# Patient Record
Sex: Female | Born: 1995 | Race: White | Hispanic: No | Marital: Single | Smoking: Never smoker
Health system: Southern US, Community
[De-identification: ages and names within clinical notes are randomized; demographics above are authoritative.]

## PROBLEM LIST (undated history)

## (undated) ENCOUNTER — Emergency Department (HOSPITAL_COMMUNITY): Payer: Self-pay | Source: Home / Self Care

## (undated) DIAGNOSIS — J45909 Unspecified asthma, uncomplicated: Secondary | ICD-10-CM

## (undated) DIAGNOSIS — F329 Major depressive disorder, single episode, unspecified: Secondary | ICD-10-CM

## (undated) DIAGNOSIS — F32A Depression, unspecified: Secondary | ICD-10-CM

## (undated) HISTORY — DX: Depression, unspecified: F32.A

## (undated) HISTORY — DX: Major depressive disorder, single episode, unspecified: F32.9

## (undated) HISTORY — PX: INCISION AND DRAINAGE: SHX5863

## (undated) HISTORY — PX: TONSILLECTOMY: SUR1361

## (undated) HISTORY — DX: Unspecified asthma, uncomplicated: J45.909

---
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
## 2016-01-26 IMAGING — US US TRANSVAGINAL NON-OB
1 series · 14 of 25 positions shown · non-contrast
Comparison: None.

CLINICAL DATA: Left adnexal pain, nausea/vomiting

EXAM:
TRANSABDOMINAL AND TRANSVAGINAL ULTRASOUND OF PELVIS
DOPPLER ULTRASOUND OF OVARIES
TECHNIQUE: Both transabdominal and transvaginal ultrasound examinations of the
pelvis were performed. Transabdominal technique was performed for
global imaging of the pelvis including uterus, ovaries, adnexal
regions, and pelvic cul-de-sac.
It was necessary to proceed with endovaginal exam following the
transabdominal exam to visualize the endometrium. Color and duplex
Doppler ultrasound was utilized to evaluate blood flow to the
ovaries.

[Series 1: us transvaginal non-ob · 0.18mm/px · 14 of 61 slices shown]
[im 1/61]
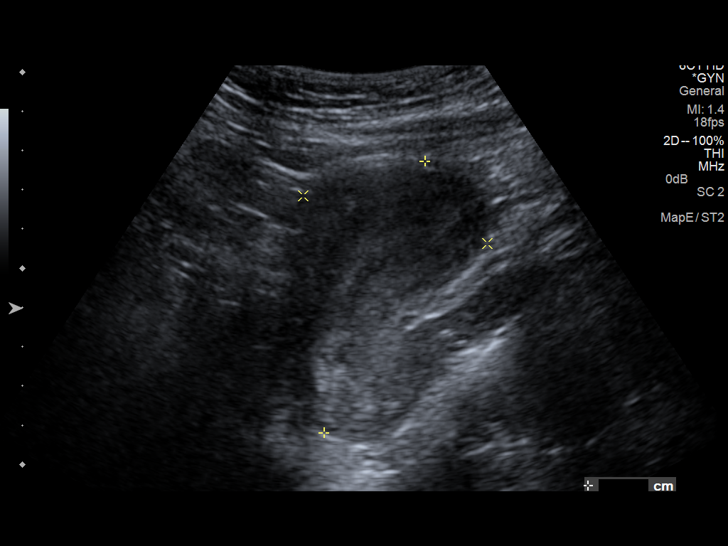
[im 6/61]
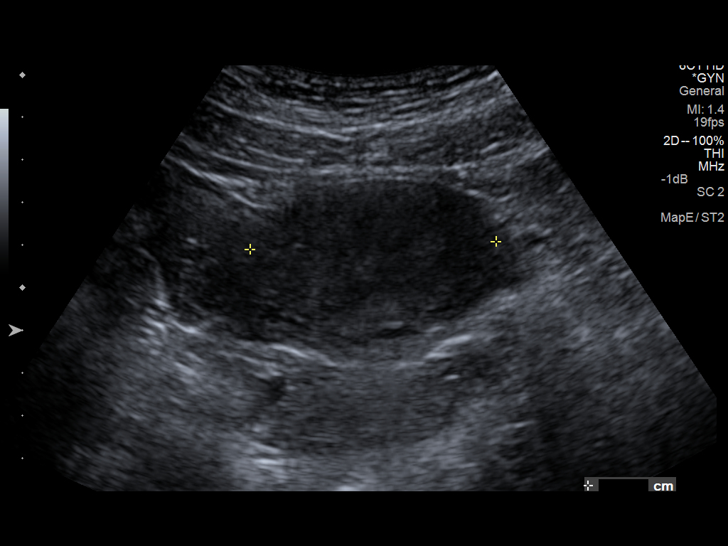
[im 11/61]
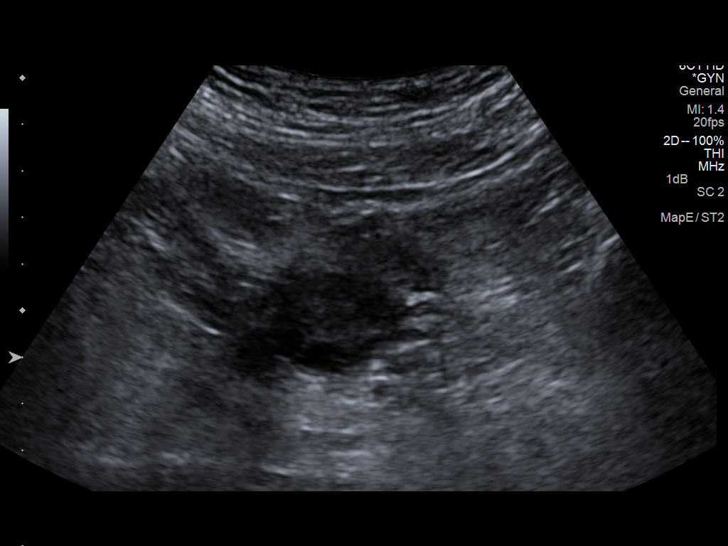
[im 16/61]
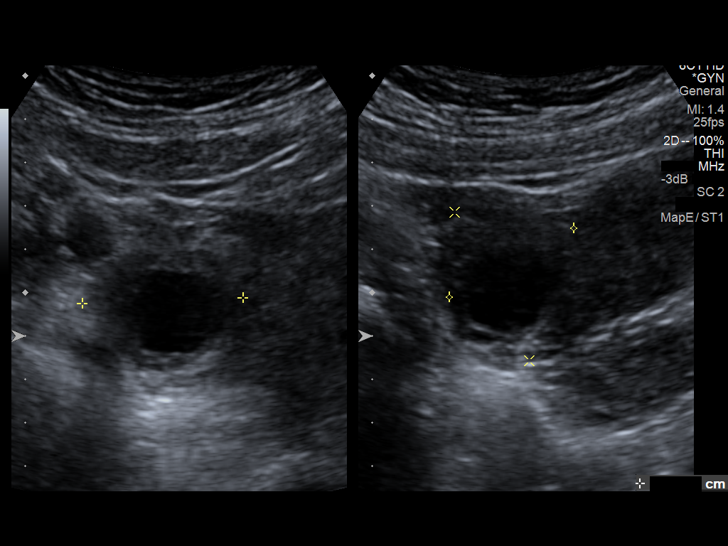
[im 21/61]
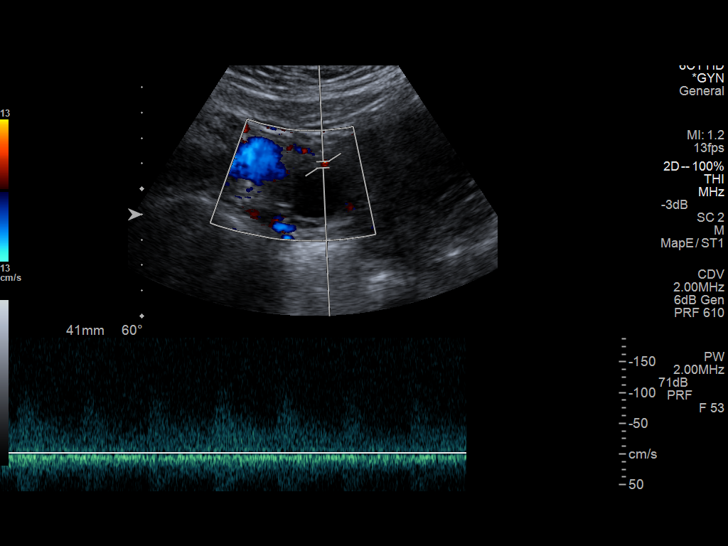
[im 23/61]
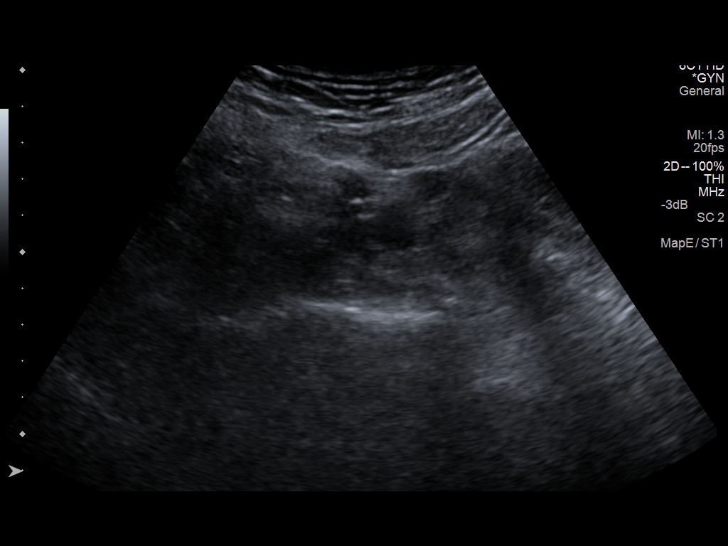
[im 28/61]
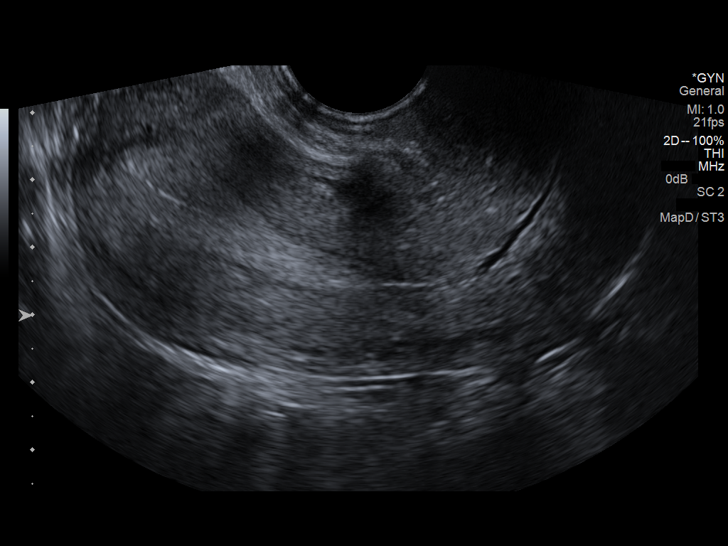
[im 33/61]
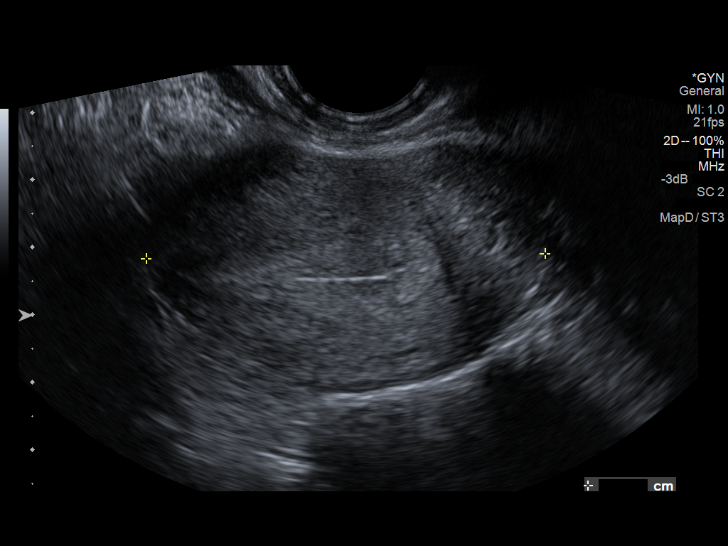
[im 38/61]
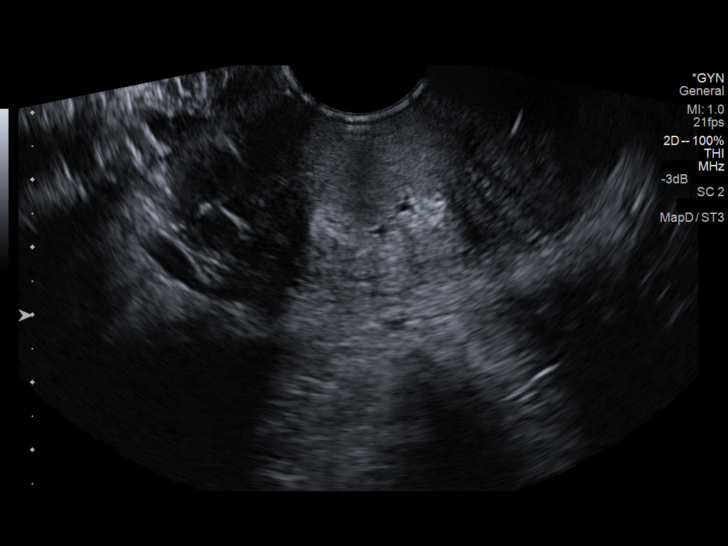
[im 41/61]
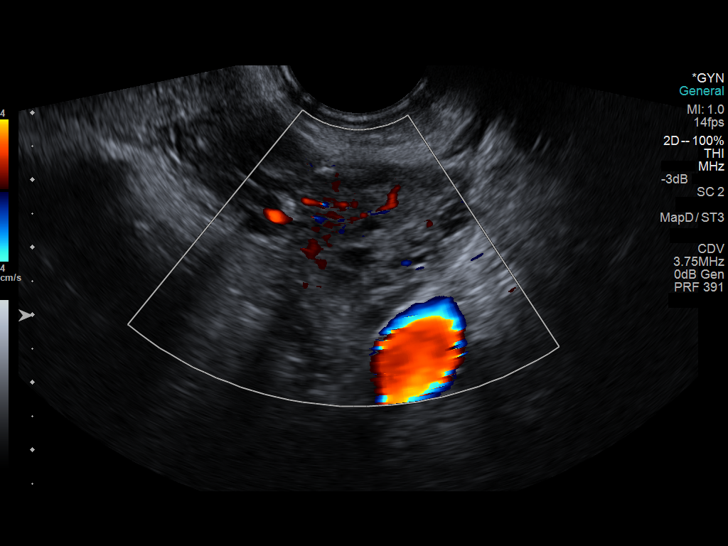
[im 46/61]
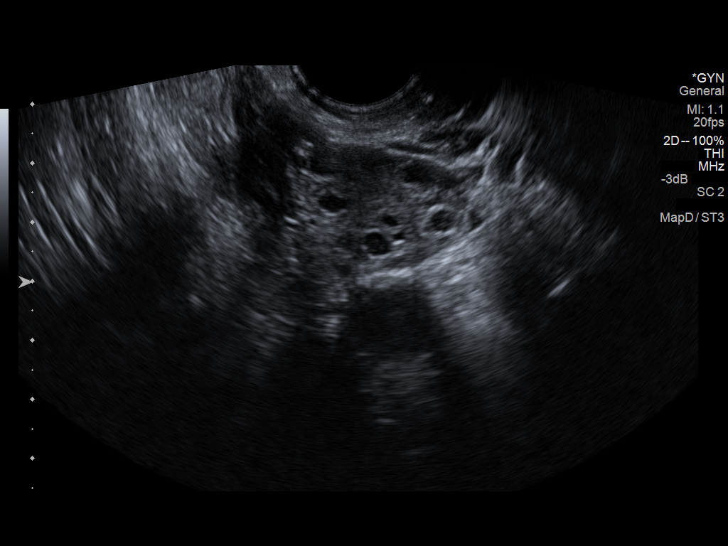
[im 51/61]
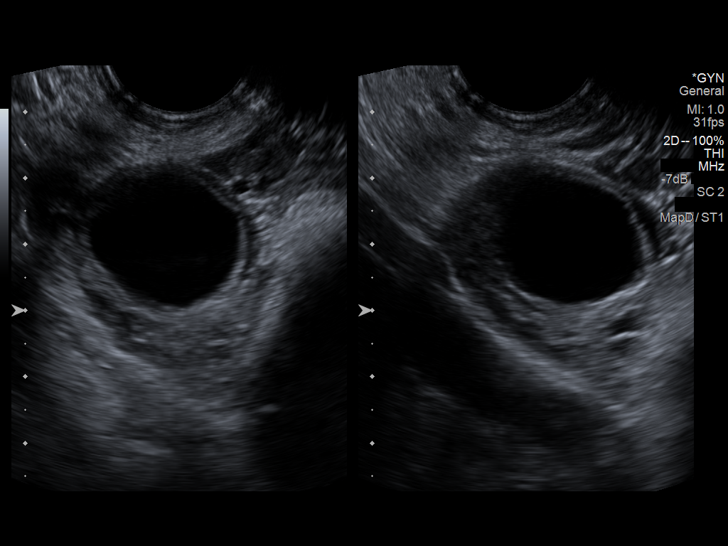
[im 56/61]
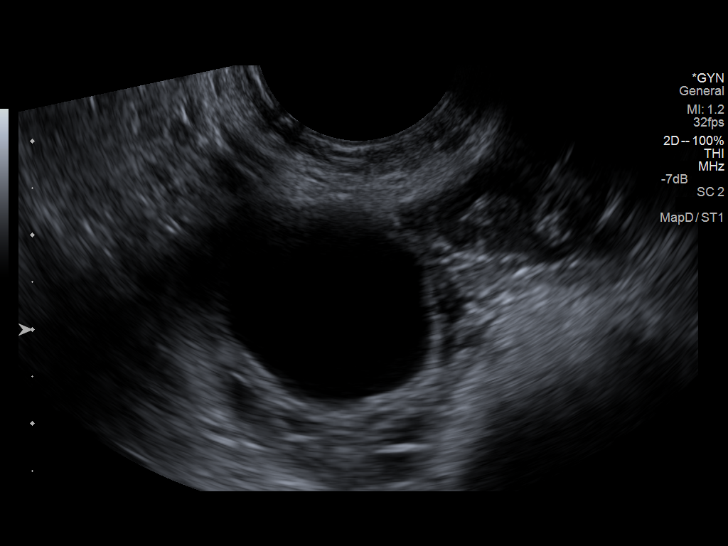
[im 61/61]
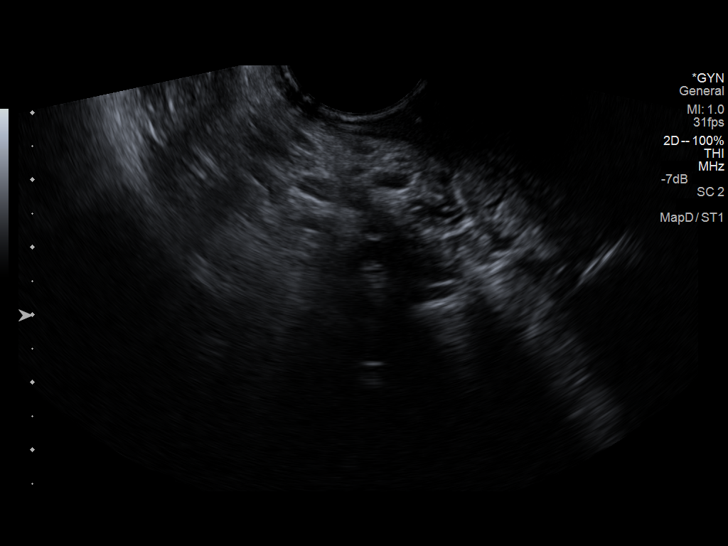

[14 of 25 positions shown; findings below may reference images not displayed]

FINDINGS: Uterus

Measurements: 11.8 x 4.0 x 6.0 cm. No fibroids or other mass
visualized.

Endometrium

Thickness: 13 mm.  No focal abnormality visualized.

Right ovary

Measurements: 3.5 x 2.9 x 3.5 cm. 2.2 x 1.9 x 1.9 cm simple
cyst/follicle, physiologic.

Left ovary

Measurements: 4.0 x 2.2 x 3.5 cm. Normal appearance/no adnexal mass.

Pulsed Doppler evaluation of both ovaries demonstrates normal
low-resistance arterial and venous waveforms.

Other findings

No free fluid.
IMPRESSION: Negative pelvic ultrasound.

No evidence of ovarian torsion.
